# Patient Record
Sex: Male | Born: 1940
Health system: Southern US, Community
[De-identification: ages and names within clinical notes are randomized; demographics above are authoritative.]

## PROBLEM LIST (undated history)

## (undated) DIAGNOSIS — I10 Essential (primary) hypertension: Secondary | ICD-10-CM

---

## 2006-07-05 ENCOUNTER — Inpatient Hospital Stay (HOSPITAL_COMMUNITY): Admission: AD | Admit: 2006-07-05 | Discharge: 2006-07-13 | Payer: Self-pay | Admitting: Psychiatry

## 2006-07-05 ENCOUNTER — Ambulatory Visit: Payer: Self-pay | Admitting: Psychiatry

## 2006-10-11 ENCOUNTER — Inpatient Hospital Stay (HOSPITAL_COMMUNITY): Admission: AD | Admit: 2006-10-11 | Discharge: 2006-10-12 | Payer: Self-pay | Admitting: *Deleted

## 2006-10-11 ENCOUNTER — Ambulatory Visit: Payer: Self-pay | Admitting: *Deleted

## 2006-10-12 ENCOUNTER — Observation Stay (HOSPITAL_COMMUNITY): Admission: EM | Admit: 2006-10-12 | Discharge: 2006-10-13 | Payer: Self-pay | Admitting: Emergency Medicine

## 2006-10-15 ENCOUNTER — Ambulatory Visit: Payer: Self-pay

## 2008-09-27 IMAGING — CR DG CHEST 1V PORT
1 series · 1 of 1 positions shown · non-contrast
Comparison: none

CLINICAL DATA: Shortness of breath and chest tightness/discomfort. 
 PORTABLE CHEST 7609 HOURS:

[view not recorded]
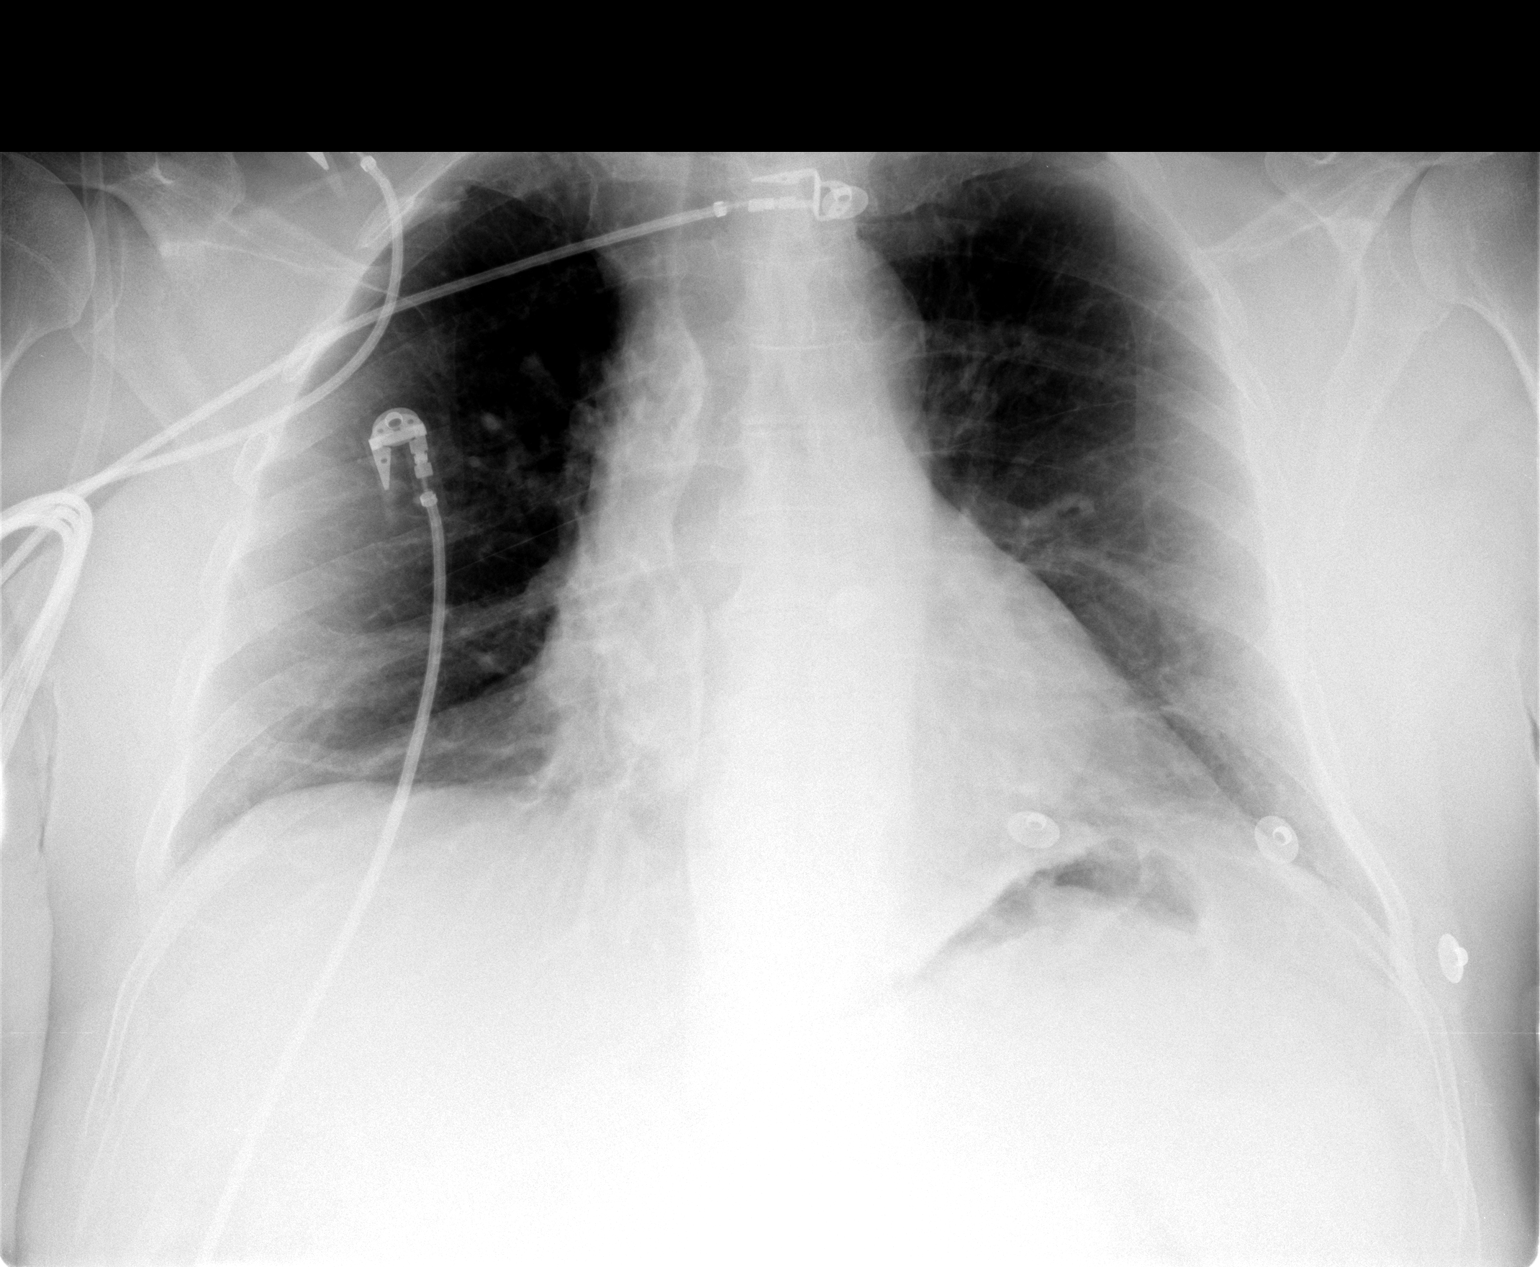

[1 of 1 positions shown; findings below may reference images not displayed]

FINDINGS: Cardiac size appears at the upper limits of normal to slightly enlarged. No CHF or acute lung process.
IMPRESSION: Probable mild cardiomegaly.

## 2010-09-07 ENCOUNTER — Encounter: Payer: Self-pay | Admitting: *Deleted

## 2011-10-09 DIAGNOSIS — M5412 Radiculopathy, cervical region: Secondary | ICD-10-CM | POA: Diagnosis not present

## 2011-10-09 DIAGNOSIS — G5620 Lesion of ulnar nerve, unspecified upper limb: Secondary | ICD-10-CM | POA: Diagnosis not present

## 2012-04-08 DIAGNOSIS — M5412 Radiculopathy, cervical region: Secondary | ICD-10-CM | POA: Diagnosis not present

## 2012-04-08 DIAGNOSIS — S066X9A Traumatic subarachnoid hemorrhage with loss of consciousness of unspecified duration, initial encounter: Secondary | ICD-10-CM | POA: Diagnosis not present

## 2012-09-26 DIAGNOSIS — Z125 Encounter for screening for malignant neoplasm of prostate: Secondary | ICD-10-CM | POA: Diagnosis not present

## 2012-09-26 DIAGNOSIS — D539 Nutritional anemia, unspecified: Secondary | ICD-10-CM | POA: Diagnosis not present

## 2012-09-26 DIAGNOSIS — Z79899 Other long term (current) drug therapy: Secondary | ICD-10-CM | POA: Diagnosis not present

## 2012-09-26 DIAGNOSIS — E7800 Pure hypercholesterolemia, unspecified: Secondary | ICD-10-CM | POA: Diagnosis not present

## 2012-10-25 DIAGNOSIS — D510 Vitamin B12 deficiency anemia due to intrinsic factor deficiency: Secondary | ICD-10-CM | POA: Diagnosis not present

## 2012-10-25 DIAGNOSIS — N400 Enlarged prostate without lower urinary tract symptoms: Secondary | ICD-10-CM | POA: Diagnosis not present

## 2012-11-21 DIAGNOSIS — H2510 Age-related nuclear cataract, unspecified eye: Secondary | ICD-10-CM | POA: Diagnosis not present

## 2013-09-04 DIAGNOSIS — L570 Actinic keratosis: Secondary | ICD-10-CM | POA: Diagnosis not present

## 2013-09-15 DIAGNOSIS — J069 Acute upper respiratory infection, unspecified: Secondary | ICD-10-CM | POA: Diagnosis not present

## 2013-09-15 DIAGNOSIS — J209 Acute bronchitis, unspecified: Secondary | ICD-10-CM | POA: Diagnosis not present

## 2014-11-16 DIAGNOSIS — F209 Schizophrenia, unspecified: Secondary | ICD-10-CM | POA: Diagnosis not present

## 2014-12-03 DIAGNOSIS — C4442 Squamous cell carcinoma of skin of scalp and neck: Secondary | ICD-10-CM | POA: Diagnosis not present

## 2014-12-06 DIAGNOSIS — C4441 Basal cell carcinoma of skin of scalp and neck: Secondary | ICD-10-CM | POA: Diagnosis not present

## 2014-12-26 DIAGNOSIS — F209 Schizophrenia, unspecified: Secondary | ICD-10-CM | POA: Diagnosis not present

## 2015-01-08 DIAGNOSIS — K219 Gastro-esophageal reflux disease without esophagitis: Secondary | ICD-10-CM | POA: Diagnosis not present

## 2015-01-08 DIAGNOSIS — D509 Iron deficiency anemia, unspecified: Secondary | ICD-10-CM | POA: Diagnosis not present

## 2015-01-08 DIAGNOSIS — Z9181 History of falling: Secondary | ICD-10-CM | POA: Diagnosis not present

## 2015-01-08 DIAGNOSIS — Z23 Encounter for immunization: Secondary | ICD-10-CM | POA: Diagnosis not present

## 2015-01-08 DIAGNOSIS — Z79899 Other long term (current) drug therapy: Secondary | ICD-10-CM | POA: Diagnosis not present

## 2015-01-08 DIAGNOSIS — F209 Schizophrenia, unspecified: Secondary | ICD-10-CM | POA: Diagnosis not present

## 2015-01-08 DIAGNOSIS — E780 Pure hypercholesterolemia: Secondary | ICD-10-CM | POA: Diagnosis not present

## 2015-01-08 DIAGNOSIS — Z0000 Encounter for general adult medical examination without abnormal findings: Secondary | ICD-10-CM | POA: Diagnosis not present

## 2015-03-20 DIAGNOSIS — F209 Schizophrenia, unspecified: Secondary | ICD-10-CM | POA: Diagnosis not present

## 2015-06-12 DIAGNOSIS — F209 Schizophrenia, unspecified: Secondary | ICD-10-CM | POA: Diagnosis not present

## 2015-07-03 DIAGNOSIS — L821 Other seborrheic keratosis: Secondary | ICD-10-CM | POA: Diagnosis not present

## 2015-07-03 DIAGNOSIS — C4441 Basal cell carcinoma of skin of scalp and neck: Secondary | ICD-10-CM | POA: Diagnosis not present

## 2015-09-13 DIAGNOSIS — K219 Gastro-esophageal reflux disease without esophagitis: Secondary | ICD-10-CM | POA: Diagnosis not present

## 2015-09-13 DIAGNOSIS — E7800 Pure hypercholesterolemia, unspecified: Secondary | ICD-10-CM | POA: Diagnosis not present

## 2015-09-16 DIAGNOSIS — F209 Schizophrenia, unspecified: Secondary | ICD-10-CM | POA: Diagnosis not present

## 2015-10-16 DIAGNOSIS — F209 Schizophrenia, unspecified: Secondary | ICD-10-CM | POA: Diagnosis not present

## 2016-01-07 DIAGNOSIS — C4441 Basal cell carcinoma of skin of scalp and neck: Secondary | ICD-10-CM | POA: Diagnosis not present

## 2016-01-07 DIAGNOSIS — L821 Other seborrheic keratosis: Secondary | ICD-10-CM | POA: Diagnosis not present

## 2016-01-07 DIAGNOSIS — L570 Actinic keratosis: Secondary | ICD-10-CM | POA: Diagnosis not present

## 2016-01-08 DIAGNOSIS — F209 Schizophrenia, unspecified: Secondary | ICD-10-CM | POA: Diagnosis not present

## 2016-03-04 DIAGNOSIS — Z1389 Encounter for screening for other disorder: Secondary | ICD-10-CM | POA: Diagnosis not present

## 2016-03-04 DIAGNOSIS — Z9181 History of falling: Secondary | ICD-10-CM | POA: Diagnosis not present

## 2016-03-04 DIAGNOSIS — Z79899 Other long term (current) drug therapy: Secondary | ICD-10-CM | POA: Diagnosis not present

## 2016-03-04 DIAGNOSIS — E785 Hyperlipidemia, unspecified: Secondary | ICD-10-CM | POA: Diagnosis not present

## 2016-03-04 DIAGNOSIS — D519 Vitamin B12 deficiency anemia, unspecified: Secondary | ICD-10-CM | POA: Diagnosis not present

## 2016-03-04 DIAGNOSIS — Z0000 Encounter for general adult medical examination without abnormal findings: Secondary | ICD-10-CM | POA: Diagnosis not present

## 2016-03-09 DIAGNOSIS — E538 Deficiency of other specified B group vitamins: Secondary | ICD-10-CM | POA: Diagnosis not present

## 2016-04-01 DIAGNOSIS — F209 Schizophrenia, unspecified: Secondary | ICD-10-CM | POA: Diagnosis not present

## 2016-04-09 DIAGNOSIS — E538 Deficiency of other specified B group vitamins: Secondary | ICD-10-CM | POA: Diagnosis not present

## 2016-05-11 DIAGNOSIS — E538 Deficiency of other specified B group vitamins: Secondary | ICD-10-CM | POA: Diagnosis not present

## 2016-06-10 DIAGNOSIS — E538 Deficiency of other specified B group vitamins: Secondary | ICD-10-CM | POA: Diagnosis not present

## 2016-06-25 DIAGNOSIS — F209 Schizophrenia, unspecified: Secondary | ICD-10-CM | POA: Diagnosis not present

## 2016-07-17 DIAGNOSIS — Z23 Encounter for immunization: Secondary | ICD-10-CM | POA: Diagnosis not present

## 2016-07-17 DIAGNOSIS — E538 Deficiency of other specified B group vitamins: Secondary | ICD-10-CM | POA: Diagnosis not present

## 2016-07-23 DIAGNOSIS — R030 Elevated blood-pressure reading, without diagnosis of hypertension: Secondary | ICD-10-CM | POA: Diagnosis not present

## 2016-08-21 DIAGNOSIS — E538 Deficiency of other specified B group vitamins: Secondary | ICD-10-CM | POA: Diagnosis not present

## 2016-09-15 DIAGNOSIS — E538 Deficiency of other specified B group vitamins: Secondary | ICD-10-CM | POA: Diagnosis not present

## 2016-09-17 DIAGNOSIS — J209 Acute bronchitis, unspecified: Secondary | ICD-10-CM | POA: Diagnosis not present

## 2016-09-30 DIAGNOSIS — F209 Schizophrenia, unspecified: Secondary | ICD-10-CM | POA: Diagnosis not present

## 2016-10-07 DIAGNOSIS — N400 Enlarged prostate without lower urinary tract symptoms: Secondary | ICD-10-CM | POA: Diagnosis not present

## 2016-10-07 DIAGNOSIS — D510 Vitamin B12 deficiency anemia due to intrinsic factor deficiency: Secondary | ICD-10-CM | POA: Diagnosis not present

## 2016-10-07 DIAGNOSIS — N3941 Urge incontinence: Secondary | ICD-10-CM | POA: Diagnosis not present

## 2016-11-12 DIAGNOSIS — D510 Vitamin B12 deficiency anemia due to intrinsic factor deficiency: Secondary | ICD-10-CM | POA: Diagnosis not present

## 2016-12-23 DIAGNOSIS — F209 Schizophrenia, unspecified: Secondary | ICD-10-CM | POA: Diagnosis not present

## 2016-12-24 DIAGNOSIS — D510 Vitamin B12 deficiency anemia due to intrinsic factor deficiency: Secondary | ICD-10-CM | POA: Diagnosis not present

## 2017-01-07 DIAGNOSIS — D510 Vitamin B12 deficiency anemia due to intrinsic factor deficiency: Secondary | ICD-10-CM | POA: Diagnosis not present

## 2017-02-09 DIAGNOSIS — D510 Vitamin B12 deficiency anemia due to intrinsic factor deficiency: Secondary | ICD-10-CM | POA: Diagnosis not present

## 2017-04-01 DIAGNOSIS — Z6827 Body mass index (BMI) 27.0-27.9, adult: Secondary | ICD-10-CM | POA: Diagnosis not present

## 2017-04-01 DIAGNOSIS — Z23 Encounter for immunization: Secondary | ICD-10-CM | POA: Diagnosis not present

## 2017-04-01 DIAGNOSIS — Z1389 Encounter for screening for other disorder: Secondary | ICD-10-CM | POA: Diagnosis not present

## 2017-04-01 DIAGNOSIS — Z0000 Encounter for general adult medical examination without abnormal findings: Secondary | ICD-10-CM | POA: Diagnosis not present

## 2017-04-15 DIAGNOSIS — D510 Vitamin B12 deficiency anemia due to intrinsic factor deficiency: Secondary | ICD-10-CM | POA: Diagnosis not present

## 2017-05-13 DIAGNOSIS — D510 Vitamin B12 deficiency anemia due to intrinsic factor deficiency: Secondary | ICD-10-CM | POA: Diagnosis not present

## 2017-05-26 DIAGNOSIS — F209 Schizophrenia, unspecified: Secondary | ICD-10-CM | POA: Diagnosis not present

## 2017-06-10 DIAGNOSIS — D510 Vitamin B12 deficiency anemia due to intrinsic factor deficiency: Secondary | ICD-10-CM | POA: Diagnosis not present

## 2017-07-14 DIAGNOSIS — S63257A Unspecified dislocation of left little finger, initial encounter: Secondary | ICD-10-CM | POA: Diagnosis not present

## 2017-07-14 DIAGNOSIS — S0990XA Unspecified injury of head, initial encounter: Secondary | ICD-10-CM | POA: Diagnosis not present

## 2017-07-22 DIAGNOSIS — D510 Vitamin B12 deficiency anemia due to intrinsic factor deficiency: Secondary | ICD-10-CM | POA: Diagnosis not present

## 2017-08-02 DIAGNOSIS — Z6825 Body mass index (BMI) 25.0-25.9, adult: Secondary | ICD-10-CM | POA: Diagnosis not present

## 2017-08-02 DIAGNOSIS — S63055A Dislocation of other carpometacarpal joint of left hand, initial encounter: Secondary | ICD-10-CM | POA: Diagnosis not present

## 2017-08-02 DIAGNOSIS — W19XXXS Unspecified fall, sequela: Secondary | ICD-10-CM | POA: Diagnosis not present

## 2017-08-02 DIAGNOSIS — Z1339 Encounter for screening examination for other mental health and behavioral disorders: Secondary | ICD-10-CM | POA: Diagnosis not present

## 2017-08-18 DIAGNOSIS — D510 Vitamin B12 deficiency anemia due to intrinsic factor deficiency: Secondary | ICD-10-CM | POA: Diagnosis not present

## 2017-09-16 DIAGNOSIS — D510 Vitamin B12 deficiency anemia due to intrinsic factor deficiency: Secondary | ICD-10-CM | POA: Diagnosis not present

## 2017-09-17 DIAGNOSIS — S63259A Unspecified dislocation of unspecified finger, initial encounter: Secondary | ICD-10-CM | POA: Diagnosis not present

## 2017-09-17 DIAGNOSIS — S63287A Dislocation of proximal interphalangeal joint of left little finger, initial encounter: Secondary | ICD-10-CM | POA: Diagnosis not present

## 2017-09-21 DIAGNOSIS — S199XXA Unspecified injury of neck, initial encounter: Secondary | ICD-10-CM | POA: Diagnosis not present

## 2017-09-21 DIAGNOSIS — S0990XA Unspecified injury of head, initial encounter: Secondary | ICD-10-CM | POA: Diagnosis not present

## 2017-09-21 DIAGNOSIS — S01511A Laceration without foreign body of lip, initial encounter: Secondary | ICD-10-CM | POA: Diagnosis not present

## 2017-09-21 DIAGNOSIS — S0993XA Unspecified injury of face, initial encounter: Secondary | ICD-10-CM | POA: Diagnosis not present

## 2017-10-14 DIAGNOSIS — D510 Vitamin B12 deficiency anemia due to intrinsic factor deficiency: Secondary | ICD-10-CM | POA: Diagnosis not present

## 2017-11-11 DIAGNOSIS — D510 Vitamin B12 deficiency anemia due to intrinsic factor deficiency: Secondary | ICD-10-CM | POA: Diagnosis not present

## 2017-11-24 DIAGNOSIS — D510 Vitamin B12 deficiency anemia due to intrinsic factor deficiency: Secondary | ICD-10-CM | POA: Diagnosis not present

## 2017-11-24 DIAGNOSIS — F209 Schizophrenia, unspecified: Secondary | ICD-10-CM | POA: Diagnosis not present

## 2017-12-09 DIAGNOSIS — D510 Vitamin B12 deficiency anemia due to intrinsic factor deficiency: Secondary | ICD-10-CM | POA: Diagnosis not present

## 2017-12-31 ENCOUNTER — Emergency Department (HOSPITAL_COMMUNITY): Payer: PPO

## 2017-12-31 ENCOUNTER — Encounter (HOSPITAL_COMMUNITY): Payer: Self-pay

## 2017-12-31 ENCOUNTER — Emergency Department (HOSPITAL_COMMUNITY)
Admission: EM | Admit: 2017-12-31 | Discharge: 2017-12-31 | Disposition: A | Discharge status: Home / Self Care | Payer: PPO | Attending: Emergency Medicine | Admitting: Emergency Medicine

## 2017-12-31 ENCOUNTER — Other Ambulatory Visit: Payer: Self-pay

## 2017-12-31 DIAGNOSIS — Y9389 Activity, other specified: Secondary | ICD-10-CM | POA: Insufficient documentation

## 2017-12-31 DIAGNOSIS — S065X9A Traumatic subdural hemorrhage with loss of consciousness of unspecified duration, initial encounter: Secondary | ICD-10-CM | POA: Insufficient documentation

## 2017-12-31 DIAGNOSIS — M25511 Pain in right shoulder: Secondary | ICD-10-CM | POA: Insufficient documentation

## 2017-12-31 DIAGNOSIS — M25512 Pain in left shoulder: Secondary | ICD-10-CM | POA: Insufficient documentation

## 2017-12-31 DIAGNOSIS — Y33XXXA Other specified events, undetermined intent, initial encounter: Secondary | ICD-10-CM | POA: Diagnosis not present

## 2017-12-31 DIAGNOSIS — S59901A Unspecified injury of right elbow, initial encounter: Secondary | ICD-10-CM | POA: Diagnosis not present

## 2017-12-31 DIAGNOSIS — S0003XA Contusion of scalp, initial encounter: Secondary | ICD-10-CM | POA: Diagnosis not present

## 2017-12-31 DIAGNOSIS — M25521 Pain in right elbow: Secondary | ICD-10-CM | POA: Diagnosis not present

## 2017-12-31 DIAGNOSIS — R10819 Abdominal tenderness, unspecified site: Secondary | ICD-10-CM | POA: Diagnosis not present

## 2017-12-31 DIAGNOSIS — S3991XA Unspecified injury of abdomen, initial encounter: Secondary | ICD-10-CM | POA: Diagnosis not present

## 2017-12-31 DIAGNOSIS — R079 Chest pain, unspecified: Secondary | ICD-10-CM | POA: Insufficient documentation

## 2017-12-31 DIAGNOSIS — S0993XA Unspecified injury of face, initial encounter: Secondary | ICD-10-CM | POA: Diagnosis not present

## 2017-12-31 DIAGNOSIS — S4992XA Unspecified injury of left shoulder and upper arm, initial encounter: Secondary | ICD-10-CM | POA: Diagnosis not present

## 2017-12-31 DIAGNOSIS — Z79899 Other long term (current) drug therapy: Secondary | ICD-10-CM | POA: Diagnosis not present

## 2017-12-31 DIAGNOSIS — M79602 Pain in left arm: Secondary | ICD-10-CM | POA: Diagnosis not present

## 2017-12-31 DIAGNOSIS — S299XXA Unspecified injury of thorax, initial encounter: Secondary | ICD-10-CM | POA: Diagnosis not present

## 2017-12-31 DIAGNOSIS — Y92410 Unspecified street and highway as the place of occurrence of the external cause: Secondary | ICD-10-CM | POA: Diagnosis not present

## 2017-12-31 DIAGNOSIS — Y998 Other external cause status: Secondary | ICD-10-CM | POA: Diagnosis not present

## 2017-12-31 DIAGNOSIS — R109 Unspecified abdominal pain: Secondary | ICD-10-CM | POA: Insufficient documentation

## 2017-12-31 DIAGNOSIS — S199XXA Unspecified injury of neck, initial encounter: Secondary | ICD-10-CM | POA: Diagnosis not present

## 2017-12-31 DIAGNOSIS — S41111A Laceration without foreign body of right upper arm, initial encounter: Secondary | ICD-10-CM | POA: Diagnosis not present

## 2017-12-31 DIAGNOSIS — S3690XA Unspecified injury of unspecified intra-abdominal organ, initial encounter: Secondary | ICD-10-CM | POA: Diagnosis not present

## 2017-12-31 DIAGNOSIS — S065XAA Traumatic subdural hemorrhage with loss of consciousness status unknown, initial encounter: Secondary | ICD-10-CM

## 2017-12-31 DIAGNOSIS — Z87891 Personal history of nicotine dependence: Secondary | ICD-10-CM | POA: Diagnosis not present

## 2017-12-31 DIAGNOSIS — I10 Essential (primary) hypertension: Secondary | ICD-10-CM | POA: Insufficient documentation

## 2017-12-31 HISTORY — DX: Essential (primary) hypertension: I10

## 2017-12-31 LAB — COMPREHENSIVE METABOLIC PANEL
ALBUMIN: 3.8 g/dL (ref 3.5–5.0)
ALK PHOS: 78 U/L (ref 38–126)
ALT: 14 U/L — ABNORMAL LOW (ref 17–63)
ANION GAP: 12 (ref 5–15)
AST: 28 U/L (ref 15–41)
BILIRUBIN TOTAL: 0.9 mg/dL (ref 0.3–1.2)
BUN: 11 mg/dL (ref 6–20)
CALCIUM: 8.5 mg/dL — AB (ref 8.9–10.3)
CO2: 26 mmol/L (ref 22–32)
Chloride: 102 mmol/L (ref 101–111)
Creatinine, Ser: 0.9 mg/dL (ref 0.61–1.24)
GFR calc non Af Amer: >60 mL/min (ref >60)
GLUCOSE: 103 mg/dL — AB (ref 65–99)
POTASSIUM: 2.9 mmol/L — AB (ref 3.5–5.1)
Sodium: 140 mmol/L (ref 135–145)
TOTAL PROTEIN: 6 g/dL — AB (ref 6.5–8.1)

## 2017-12-31 LAB — LIPASE, BLOOD: LIPASE: 25 U/L (ref 11–51)

## 2017-12-31 LAB — CBC WITH DIFFERENTIAL/PLATELET
Abs Immature Granulocytes: 0.1 10*3/uL (ref 0.0–0.1)
BASOS PCT: 0 %
Basophils Absolute: 0 10*3/uL (ref 0.0–0.1)
EOS PCT: 0 %
Eosinophils Absolute: 0 10*3/uL (ref 0.0–0.7)
HEMATOCRIT: 38.5 % — AB (ref 39.0–52.0)
Hemoglobin: 13.5 g/dL (ref 13.0–17.0)
Immature Granulocytes: 1 %
LYMPHS ABS: 0.8 10*3/uL (ref 0.7–4.0)
Lymphocytes Relative: 8 %
MCH: 30.3 pg (ref 26.0–34.0)
MCHC: 35.1 g/dL (ref 30.0–36.0)
MCV: 86.5 fL (ref 78.0–100.0)
MONOS PCT: 6 %
Monocytes Absolute: 0.7 10*3/uL (ref 0.1–1.0)
Neutro Abs: 8.9 10*3/uL — ABNORMAL HIGH (ref 1.7–7.7)
Neutrophils Relative %: 85 %
Platelets: 208 10*3/uL (ref 150–400)
RBC: 4.45 MIL/uL (ref 4.22–5.81)
RDW: 12.4 % (ref 11.5–15.5)
WBC: 10.6 10*3/uL — AB (ref 4.0–10.5)

## 2017-12-31 LAB — URINALYSIS, ROUTINE W REFLEX MICROSCOPIC
BILIRUBIN URINE: NEGATIVE
GLUCOSE, UA: NEGATIVE mg/dL
KETONES UR: 5 mg/dL — AB
LEUKOCYTES UA: NEGATIVE
NITRITE: NEGATIVE
PH: 7 (ref 5.0–8.0)
Protein, ur: NEGATIVE mg/dL
SPECIFIC GRAVITY, URINE: 1.029 (ref 1.005–1.030)

## 2017-12-31 LAB — I-STAT CREATININE, ED: Creatinine, Ser: 0.8 mg/dL (ref 0.61–1.24)

## 2017-12-31 MED ORDER — FENTANYL CITRATE (PF) 100 MCG/2ML IJ SOLN
25.0000 ug | Freq: Once | INTRAMUSCULAR | Status: AC
  Administered 2017-12-31: 25 ug via INTRAVENOUS
  Filled 2017-12-31: qty 2

## 2017-12-31 MED ORDER — HYDROCODONE-ACETAMINOPHEN 5-325 MG PO TABS: 1.0000 | ORAL_TABLET | Freq: Two times a day (BID) | ORAL | 0 refills | Status: DC | PRN

## 2017-12-31 MED ORDER — HYDROCODONE-ACETAMINOPHEN 5-325 MG PO TABS: 1.0000 | ORAL_TABLET | Freq: Once | ORAL | Status: DC

## 2017-12-31 MED ORDER — DOXYCYCLINE HYCLATE 100 MG PO CAPS: 100.0000 mg | ORAL_CAPSULE | Freq: Two times a day (BID) | ORAL | 0 refills | Status: AC

## 2017-12-31 MED ORDER — FENTANYL CITRATE (PF) 100 MCG/2ML IJ SOLN
25.0000 ug | Freq: Once | INTRAMUSCULAR | Status: AC
Start: 2017-12-31 — End: 2017-12-31
  Administered 2017-12-31: 25 ug via INTRAVENOUS
  Filled 2017-12-31: qty 2

## 2017-12-31 MED ORDER — POTASSIUM CHLORIDE CRYS ER 20 MEQ PO TBCR
40.0000 meq | EXTENDED_RELEASE_TABLET | Freq: Once | ORAL | Status: AC
  Administered 2017-12-31: 40 meq via ORAL
  Filled 2017-12-31: qty 2

## 2017-12-31 MED ORDER — LIDOCAINE-EPINEPHRINE (PF) 2 %-1:200000 IJ SOLN
10.0000 mL | Freq: Once | INTRAMUSCULAR | Status: AC
  Administered 2017-12-31: 10 mL via INTRADERMAL
  Filled 2017-12-31: qty 20

## 2017-12-31 MED ORDER — IOHEXOL 300 MG/ML  SOLN
100.0000 mL | Freq: Once | INTRAMUSCULAR | Status: AC | PRN
  Administered 2017-12-31: 100 mL via INTRAVENOUS

## 2017-12-31 MED ORDER — SODIUM CHLORIDE 0.9 % IV BOLUS
500.0000 mL | Freq: Once | INTRAVENOUS | Status: AC
  Administered 2017-12-31: 500 mL via INTRAVENOUS

## 2017-12-31 NOTE — ED Notes (Signed)
Pt transported from xray to CT

## 2017-12-31 NOTE — ED Notes (Signed)
Portable xray at bedside.
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
## 2017-12-31 NOTE — Progress Notes (Signed)
Orthopedic Tech Progress Note Patient Details:  [name] Nov 01, 1940 [id number] Called [employer] for brace. Patient ID: Keith Schaefer, male   DOB: 1940/09/12, 77 y.o.   MRN: [id number]   Braulio Bosch 12/31/2017, 9:22 PM

## 2017-12-31 NOTE — Discharge Instructions (Addendum)
You were given a prescription for antibiotics. Please take the antibiotic prescription fully.   I have prescribed a new medication for you today. It is important that when you pick the prescription up you discuss the potential interactions of this medication with other medications you are taking, including over the counter medications, with the pharmacists.   This new medication has potential side effects. Be sure to contact your primary care provider or return to the emergency department if you are experiencing new symptoms that you are unable to tolerate after starting the medication. You need to receive medical evaluation immediately if you start to experience blistering of the skin, rash, swelling, or difficulty breathing as these signs could indicate a more serious medication side effect.   Please return to either your primary care doctor, urgent care, or the ER to have your sutures removed in 10 to 14 days.  Please follow-up with your primary care regardless next week for reevaluation.  You were given a back brace to help with your symptoms of pain.  You were also given pain medication for your symptoms.  You may take 500 to 650 mg of Tylenol every 6 hours for your pain.  If you are having breakthrough pain you may take 1 Norco.  Do not exceed more than 4 g of Tylenol in a day.  Please return to the ER if you are having any changes in mental status, confusion, difficulty walking, numbness or tingling to your legs, weakness in your legs, loss of control of your bowels or bladder function, or any new or worsening symptoms.
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
## 2018-01-31 DIAGNOSIS — E876 Hypokalemia: Secondary | ICD-10-CM | POA: Diagnosis not present

## 2018-02-02 DIAGNOSIS — F209 Schizophrenia, unspecified: Secondary | ICD-10-CM | POA: Diagnosis not present

## 2018-02-16 DIAGNOSIS — F209 Schizophrenia, unspecified: Secondary | ICD-10-CM | POA: Diagnosis not present

## 2018-03-03 DIAGNOSIS — F209 Schizophrenia, unspecified: Secondary | ICD-10-CM | POA: Diagnosis not present

## 2018-03-15 DIAGNOSIS — F209 Schizophrenia, unspecified: Secondary | ICD-10-CM | POA: Diagnosis not present

## 2018-03-30 DIAGNOSIS — F209 Schizophrenia, unspecified: Secondary | ICD-10-CM | POA: Diagnosis not present

## 2018-05-02 DIAGNOSIS — F209 Schizophrenia, unspecified: Secondary | ICD-10-CM | POA: Diagnosis not present

## 2018-06-08 DIAGNOSIS — Z23 Encounter for immunization: Secondary | ICD-10-CM | POA: Diagnosis not present

## 2018-08-03 DIAGNOSIS — F209 Schizophrenia, unspecified: Secondary | ICD-10-CM | POA: Diagnosis not present

## 2018-08-25 DIAGNOSIS — F209 Schizophrenia, unspecified: Secondary | ICD-10-CM | POA: Diagnosis not present

## 2018-10-19 DIAGNOSIS — F209 Schizophrenia, unspecified: Secondary | ICD-10-CM | POA: Diagnosis not present

## 2018-10-30 DIAGNOSIS — R51 Headache: Secondary | ICD-10-CM | POA: Diagnosis not present

## 2018-10-30 DIAGNOSIS — M25512 Pain in left shoulder: Secondary | ICD-10-CM | POA: Diagnosis not present

## 2018-10-30 DIAGNOSIS — S43085A Other dislocation of left shoulder joint, initial encounter: Secondary | ICD-10-CM | POA: Diagnosis not present

## 2018-10-30 DIAGNOSIS — S0990XA Unspecified injury of head, initial encounter: Secondary | ICD-10-CM | POA: Diagnosis not present

## 2018-10-30 DIAGNOSIS — S4292XA Fracture of left shoulder girdle, part unspecified, initial encounter for closed fracture: Secondary | ICD-10-CM | POA: Diagnosis not present

## 2018-10-30 DIAGNOSIS — S43005A Unspecified dislocation of left shoulder joint, initial encounter: Secondary | ICD-10-CM | POA: Diagnosis not present

## 2018-10-30 DIAGNOSIS — S42292A Other displaced fracture of upper end of left humerus, initial encounter for closed fracture: Secondary | ICD-10-CM | POA: Diagnosis not present

## 2018-10-30 DIAGNOSIS — S0083XA Contusion of other part of head, initial encounter: Secondary | ICD-10-CM | POA: Diagnosis not present

## 2018-10-31 DIAGNOSIS — S42202A Unspecified fracture of upper end of left humerus, initial encounter for closed fracture: Secondary | ICD-10-CM | POA: Diagnosis not present

## 2018-10-31 DIAGNOSIS — S43005A Unspecified dislocation of left shoulder joint, initial encounter: Secondary | ICD-10-CM | POA: Diagnosis not present

## 2018-11-14 DIAGNOSIS — S42202A Unspecified fracture of upper end of left humerus, initial encounter for closed fracture: Secondary | ICD-10-CM | POA: Diagnosis not present

## 2019-01-06 DIAGNOSIS — L03039 Cellulitis of unspecified toe: Secondary | ICD-10-CM | POA: Diagnosis not present

## 2019-01-06 DIAGNOSIS — J40 Bronchitis, not specified as acute or chronic: Secondary | ICD-10-CM | POA: Diagnosis not present

## 2019-01-06 DIAGNOSIS — F209 Schizophrenia, unspecified: Secondary | ICD-10-CM | POA: Diagnosis not present

## 2019-01-06 DIAGNOSIS — E7800 Pure hypercholesterolemia, unspecified: Secondary | ICD-10-CM | POA: Diagnosis not present

## 2019-01-11 DIAGNOSIS — F209 Schizophrenia, unspecified: Secondary | ICD-10-CM | POA: Diagnosis not present

## 2019-02-14 DIAGNOSIS — F209 Schizophrenia, unspecified: Secondary | ICD-10-CM | POA: Diagnosis not present

## 2019-02-14 DIAGNOSIS — D519 Vitamin B12 deficiency anemia, unspecified: Secondary | ICD-10-CM | POA: Diagnosis not present

## 2019-02-14 DIAGNOSIS — K219 Gastro-esophageal reflux disease without esophagitis: Secondary | ICD-10-CM | POA: Diagnosis not present

## 2019-03-09 DIAGNOSIS — F209 Schizophrenia, unspecified: Secondary | ICD-10-CM | POA: Diagnosis not present

## 2019-03-17 ENCOUNTER — Other Ambulatory Visit: Payer: Self-pay

## 2019-03-23 DIAGNOSIS — F209 Schizophrenia, unspecified: Secondary | ICD-10-CM | POA: Diagnosis not present

## 2019-04-06 DIAGNOSIS — F209 Schizophrenia, unspecified: Secondary | ICD-10-CM | POA: Diagnosis not present

## 2019-04-12 DIAGNOSIS — F209 Schizophrenia, unspecified: Secondary | ICD-10-CM | POA: Diagnosis not present

## 2019-04-20 DIAGNOSIS — F209 Schizophrenia, unspecified: Secondary | ICD-10-CM | POA: Diagnosis not present

## 2019-05-04 DIAGNOSIS — F209 Schizophrenia, unspecified: Secondary | ICD-10-CM | POA: Diagnosis not present

## 2019-05-16 DIAGNOSIS — F209 Schizophrenia, unspecified: Secondary | ICD-10-CM | POA: Diagnosis not present

## 2019-05-18 DIAGNOSIS — F209 Schizophrenia, unspecified: Secondary | ICD-10-CM | POA: Diagnosis not present

## 2019-06-01 DIAGNOSIS — F209 Schizophrenia, unspecified: Secondary | ICD-10-CM | POA: Diagnosis not present

## 2019-06-05 DIAGNOSIS — F209 Schizophrenia, unspecified: Secondary | ICD-10-CM | POA: Diagnosis not present

## 2019-06-05 DIAGNOSIS — Z23 Encounter for immunization: Secondary | ICD-10-CM | POA: Diagnosis not present

## 2019-06-05 DIAGNOSIS — R4182 Altered mental status, unspecified: Secondary | ICD-10-CM | POA: Diagnosis not present

## 2019-06-05 DIAGNOSIS — Z79899 Other long term (current) drug therapy: Secondary | ICD-10-CM | POA: Diagnosis not present

## 2019-06-07 DIAGNOSIS — E785 Hyperlipidemia, unspecified: Secondary | ICD-10-CM | POA: Diagnosis not present

## 2019-06-07 DIAGNOSIS — F209 Schizophrenia, unspecified: Secondary | ICD-10-CM | POA: Diagnosis not present

## 2019-06-07 DIAGNOSIS — Z20828 Contact with and (suspected) exposure to other viral communicable diseases: Secondary | ICD-10-CM | POA: Diagnosis not present

## 2019-06-07 DIAGNOSIS — N400 Enlarged prostate without lower urinary tract symptoms: Secondary | ICD-10-CM | POA: Diagnosis not present

## 2019-06-07 DIAGNOSIS — J9690 Respiratory failure, unspecified, unspecified whether with hypoxia or hypercapnia: Secondary | ICD-10-CM | POA: Diagnosis not present

## 2019-06-07 DIAGNOSIS — E538 Deficiency of other specified B group vitamins: Secondary | ICD-10-CM | POA: Diagnosis not present

## 2019-06-07 DIAGNOSIS — K5900 Constipation, unspecified: Secondary | ICD-10-CM | POA: Diagnosis not present

## 2019-06-07 DIAGNOSIS — Z79899 Other long term (current) drug therapy: Secondary | ICD-10-CM | POA: Diagnosis not present

## 2019-06-07 DIAGNOSIS — K219 Gastro-esophageal reflux disease without esophagitis: Secondary | ICD-10-CM | POA: Diagnosis not present

## 2019-06-08 DIAGNOSIS — F209 Schizophrenia, unspecified: Secondary | ICD-10-CM | POA: Diagnosis not present

## 2019-06-09 DIAGNOSIS — F209 Schizophrenia, unspecified: Secondary | ICD-10-CM | POA: Diagnosis not present

## 2019-06-09 DIAGNOSIS — N400 Enlarged prostate without lower urinary tract symptoms: Secondary | ICD-10-CM | POA: Diagnosis not present

## 2019-06-09 DIAGNOSIS — E785 Hyperlipidemia, unspecified: Secondary | ICD-10-CM | POA: Diagnosis not present

## 2019-06-09 DIAGNOSIS — K219 Gastro-esophageal reflux disease without esophagitis: Secondary | ICD-10-CM | POA: Diagnosis not present

## 2019-06-10 DIAGNOSIS — F209 Schizophrenia, unspecified: Secondary | ICD-10-CM | POA: Diagnosis not present

## 2019-06-11 DIAGNOSIS — K219 Gastro-esophageal reflux disease without esophagitis: Secondary | ICD-10-CM | POA: Diagnosis not present

## 2019-06-11 DIAGNOSIS — F209 Schizophrenia, unspecified: Secondary | ICD-10-CM | POA: Diagnosis not present

## 2019-06-11 DIAGNOSIS — N400 Enlarged prostate without lower urinary tract symptoms: Secondary | ICD-10-CM | POA: Diagnosis not present

## 2019-06-11 DIAGNOSIS — E785 Hyperlipidemia, unspecified: Secondary | ICD-10-CM | POA: Diagnosis not present

## 2019-06-12 DIAGNOSIS — F209 Schizophrenia, unspecified: Secondary | ICD-10-CM | POA: Diagnosis not present

## 2019-06-13 DIAGNOSIS — F209 Schizophrenia, unspecified: Secondary | ICD-10-CM | POA: Diagnosis not present

## 2019-06-13 DIAGNOSIS — K219 Gastro-esophageal reflux disease without esophagitis: Secondary | ICD-10-CM | POA: Diagnosis not present

## 2019-06-13 DIAGNOSIS — E785 Hyperlipidemia, unspecified: Secondary | ICD-10-CM | POA: Diagnosis not present

## 2019-06-13 DIAGNOSIS — N400 Enlarged prostate without lower urinary tract symptoms: Secondary | ICD-10-CM | POA: Diagnosis not present

## 2019-06-14 DIAGNOSIS — F209 Schizophrenia, unspecified: Secondary | ICD-10-CM | POA: Diagnosis not present

## 2019-06-27 DIAGNOSIS — F209 Schizophrenia, unspecified: Secondary | ICD-10-CM | POA: Diagnosis not present

## 2019-07-04 DIAGNOSIS — F209 Schizophrenia, unspecified: Secondary | ICD-10-CM | POA: Diagnosis not present

## 2019-07-11 DIAGNOSIS — F209 Schizophrenia, unspecified: Secondary | ICD-10-CM | POA: Diagnosis not present

## 2019-07-17 DIAGNOSIS — D519 Vitamin B12 deficiency anemia, unspecified: Secondary | ICD-10-CM | POA: Diagnosis not present

## 2019-07-17 DIAGNOSIS — K219 Gastro-esophageal reflux disease without esophagitis: Secondary | ICD-10-CM | POA: Diagnosis not present

## 2019-07-18 DIAGNOSIS — K5909 Other constipation: Secondary | ICD-10-CM | POA: Diagnosis not present

## 2019-07-18 DIAGNOSIS — F203 Undifferentiated schizophrenia: Secondary | ICD-10-CM | POA: Diagnosis not present

## 2019-07-18 DIAGNOSIS — Z79899 Other long term (current) drug therapy: Secondary | ICD-10-CM | POA: Diagnosis not present

## 2019-07-18 DIAGNOSIS — R443 Hallucinations, unspecified: Secondary | ICD-10-CM | POA: Diagnosis not present

## 2019-07-18 DIAGNOSIS — Z20828 Contact with and (suspected) exposure to other viral communicable diseases: Secondary | ICD-10-CM | POA: Diagnosis not present

## 2019-07-18 DIAGNOSIS — F39 Unspecified mood [affective] disorder: Secondary | ICD-10-CM | POA: Diagnosis not present

## 2019-07-18 DIAGNOSIS — E538 Deficiency of other specified B group vitamins: Secondary | ICD-10-CM | POA: Diagnosis not present

## 2019-07-18 DIAGNOSIS — F209 Schizophrenia, unspecified: Secondary | ICD-10-CM | POA: Diagnosis not present

## 2019-07-18 DIAGNOSIS — E785 Hyperlipidemia, unspecified: Secondary | ICD-10-CM | POA: Diagnosis not present

## 2019-07-18 DIAGNOSIS — K219 Gastro-esophageal reflux disease without esophagitis: Secondary | ICD-10-CM | POA: Diagnosis not present

## 2019-07-18 DIAGNOSIS — N400 Enlarged prostate without lower urinary tract symptoms: Secondary | ICD-10-CM | POA: Diagnosis not present

## 2019-08-09 DIAGNOSIS — F209 Schizophrenia, unspecified: Secondary | ICD-10-CM | POA: Diagnosis not present

## 2019-08-14 DIAGNOSIS — F209 Schizophrenia, unspecified: Secondary | ICD-10-CM | POA: Diagnosis not present

## 2019-08-23 DIAGNOSIS — F209 Schizophrenia, unspecified: Secondary | ICD-10-CM | POA: Diagnosis not present

## 2019-09-06 DIAGNOSIS — F209 Schizophrenia, unspecified: Secondary | ICD-10-CM | POA: Diagnosis not present

## 2019-09-16 DIAGNOSIS — N400 Enlarged prostate without lower urinary tract symptoms: Secondary | ICD-10-CM | POA: Diagnosis not present

## 2019-09-16 DIAGNOSIS — E7800 Pure hypercholesterolemia, unspecified: Secondary | ICD-10-CM | POA: Diagnosis not present

## 2019-09-16 DIAGNOSIS — D519 Vitamin B12 deficiency anemia, unspecified: Secondary | ICD-10-CM | POA: Diagnosis not present

## 2019-09-21 DIAGNOSIS — F209 Schizophrenia, unspecified: Secondary | ICD-10-CM | POA: Diagnosis not present

## 2019-09-27 DIAGNOSIS — I4581 Long QT syndrome: Secondary | ICD-10-CM | POA: Diagnosis not present

## 2019-09-27 DIAGNOSIS — K219 Gastro-esophageal reflux disease without esophagitis: Secondary | ICD-10-CM | POA: Diagnosis not present

## 2019-09-27 DIAGNOSIS — Z23 Encounter for immunization: Secondary | ICD-10-CM | POA: Diagnosis not present

## 2019-09-27 DIAGNOSIS — E785 Hyperlipidemia, unspecified: Secondary | ICD-10-CM | POA: Diagnosis not present

## 2019-09-27 DIAGNOSIS — Z20822 Contact with and (suspected) exposure to covid-19: Secondary | ICD-10-CM | POA: Diagnosis not present

## 2019-09-27 DIAGNOSIS — F0390 Unspecified dementia without behavioral disturbance: Secondary | ICD-10-CM | POA: Diagnosis not present

## 2019-09-27 DIAGNOSIS — F200 Paranoid schizophrenia: Secondary | ICD-10-CM | POA: Diagnosis not present

## 2019-09-27 DIAGNOSIS — K5900 Constipation, unspecified: Secondary | ICD-10-CM | POA: Diagnosis not present

## 2019-09-27 DIAGNOSIS — R4182 Altered mental status, unspecified: Secondary | ICD-10-CM | POA: Diagnosis not present

## 2019-09-27 DIAGNOSIS — K5909 Other constipation: Secondary | ICD-10-CM | POA: Diagnosis not present

## 2019-09-27 DIAGNOSIS — N400 Enlarged prostate without lower urinary tract symptoms: Secondary | ICD-10-CM | POA: Diagnosis not present

## 2019-09-27 DIAGNOSIS — E559 Vitamin D deficiency, unspecified: Secondary | ICD-10-CM | POA: Diagnosis not present

## 2019-09-27 DIAGNOSIS — Z0000 Encounter for general adult medical examination without abnormal findings: Secondary | ICD-10-CM | POA: Diagnosis not present

## 2019-09-27 DIAGNOSIS — R9431 Abnormal electrocardiogram [ECG] [EKG]: Secondary | ICD-10-CM | POA: Diagnosis not present

## 2019-09-27 DIAGNOSIS — E782 Mixed hyperlipidemia: Secondary | ICD-10-CM | POA: Diagnosis not present

## 2019-09-27 DIAGNOSIS — D649 Anemia, unspecified: Secondary | ICD-10-CM | POA: Diagnosis not present

## 2019-09-27 DIAGNOSIS — Z79899 Other long term (current) drug therapy: Secondary | ICD-10-CM | POA: Diagnosis not present

## 2019-09-27 DIAGNOSIS — F2089 Other schizophrenia: Secondary | ICD-10-CM | POA: Diagnosis not present

## 2019-09-27 DIAGNOSIS — E538 Deficiency of other specified B group vitamins: Secondary | ICD-10-CM | POA: Diagnosis not present

## 2019-09-27 DIAGNOSIS — F209 Schizophrenia, unspecified: Secondary | ICD-10-CM | POA: Diagnosis not present

## 2019-10-16 DIAGNOSIS — F209 Schizophrenia, unspecified: Secondary | ICD-10-CM | POA: Diagnosis not present

## 2019-10-18 DIAGNOSIS — F209 Schizophrenia, unspecified: Secondary | ICD-10-CM | POA: Diagnosis not present

## 2019-11-02 DIAGNOSIS — F209 Schizophrenia, unspecified: Secondary | ICD-10-CM | POA: Diagnosis not present

## 2019-11-15 DIAGNOSIS — F209 Schizophrenia, unspecified: Secondary | ICD-10-CM | POA: Diagnosis not present

## 2019-11-30 DIAGNOSIS — F209 Schizophrenia, unspecified: Secondary | ICD-10-CM | POA: Diagnosis not present

## 2019-12-14 DIAGNOSIS — F209 Schizophrenia, unspecified: Secondary | ICD-10-CM | POA: Diagnosis not present

## 2019-12-17 IMAGING — CT CT HEAD W/O CM
1 series · 1 of 1 positions shown · non-contrast
Comparison: CT 12/31/2017 at 5556 a.m.

CLINICAL DATA: Subdural hemorrhage

EXAM:
CT HEAD WITHOUT CONTRAST
TECHNIQUE: Contiguous axial images were obtained from the base of the skull
through the vertex without intravenous contrast.

[Series 1: topogram 0.6 tr20 · sagittal · 1.00mm/px · 1 of 1 slices shown]
[im 1/1]
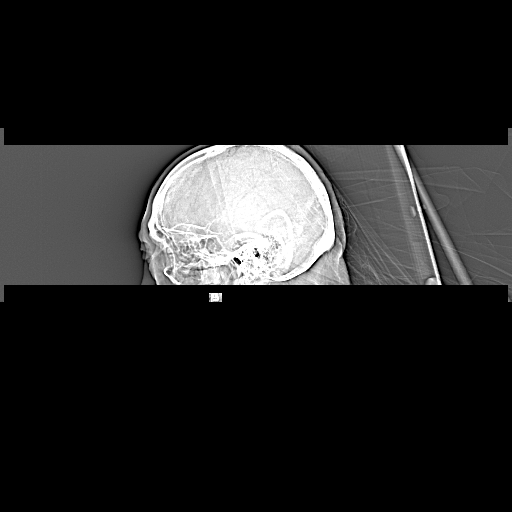

[1 of 1 positions shown; findings below may reference images not displayed]

FINDINGS: Brain: Thin subdural hematoma along the interhemispheric fissure is
not changed in volume measuring 3 mm thickness (image [DATE]). No new
extra-axial fluid collections. No intraparenchymal hemorrhage.
Intraventricular hemorrhage.

No acute intracranial hemorrhage. No focal mass lesion. No CT
evidence of acute infarction. No midline shift or mass effect. No
hydrocephalus. Basilar cisterns are patent.

Vascular: No hyperdense vessel or unexpected calcification.

Skull: No skull fracture

Sinuses/Orbits: No acute finding.

Other: Shallow scalp hematoma over the anterior LEFT frontal bone
measuring 6 mm in depth, unchanged
IMPRESSION: 1. No change in thin subdural hematoma along the superior
interhemispheric fissure.
2. Shallow LEFT frontal scalp hematoma without associated skull
fracture.
3. No interval change.
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
## 2019-12-26 DIAGNOSIS — F209 Schizophrenia, unspecified: Secondary | ICD-10-CM | POA: Diagnosis not present

## 2020-01-11 DIAGNOSIS — F209 Schizophrenia, unspecified: Secondary | ICD-10-CM | POA: Diagnosis not present

## 2020-01-15 DIAGNOSIS — E7800 Pure hypercholesterolemia, unspecified: Secondary | ICD-10-CM | POA: Diagnosis not present

## 2020-01-15 DIAGNOSIS — D519 Vitamin B12 deficiency anemia, unspecified: Secondary | ICD-10-CM | POA: Diagnosis not present

## 2020-01-15 DIAGNOSIS — N400 Enlarged prostate without lower urinary tract symptoms: Secondary | ICD-10-CM | POA: Diagnosis not present

## 2020-01-25 DIAGNOSIS — F209 Schizophrenia, unspecified: Secondary | ICD-10-CM | POA: Diagnosis not present

## 2020-02-08 DIAGNOSIS — F209 Schizophrenia, unspecified: Secondary | ICD-10-CM | POA: Diagnosis not present

## 2020-02-22 DIAGNOSIS — F209 Schizophrenia, unspecified: Secondary | ICD-10-CM | POA: Diagnosis not present

## 2020-02-26 DIAGNOSIS — F209 Schizophrenia, unspecified: Secondary | ICD-10-CM | POA: Diagnosis not present

## 2020-03-07 DIAGNOSIS — F209 Schizophrenia, unspecified: Secondary | ICD-10-CM | POA: Diagnosis not present

## 2020-03-21 DIAGNOSIS — F209 Schizophrenia, unspecified: Secondary | ICD-10-CM | POA: Diagnosis not present

## 2020-04-18 DIAGNOSIS — F209 Schizophrenia, unspecified: Secondary | ICD-10-CM | POA: Diagnosis not present

## 2020-05-01 DIAGNOSIS — F209 Schizophrenia, unspecified: Secondary | ICD-10-CM | POA: Diagnosis not present

## 2020-05-14 DIAGNOSIS — F209 Schizophrenia, unspecified: Secondary | ICD-10-CM | POA: Diagnosis not present

## 2020-05-30 DIAGNOSIS — F209 Schizophrenia, unspecified: Secondary | ICD-10-CM | POA: Diagnosis not present

## 2020-06-12 DIAGNOSIS — F209 Schizophrenia, unspecified: Secondary | ICD-10-CM | POA: Diagnosis not present

## 2020-06-26 DIAGNOSIS — F209 Schizophrenia, unspecified: Secondary | ICD-10-CM | POA: Diagnosis not present

## 2020-07-09 DIAGNOSIS — F209 Schizophrenia, unspecified: Secondary | ICD-10-CM | POA: Diagnosis not present

## 2020-07-25 DIAGNOSIS — F209 Schizophrenia, unspecified: Secondary | ICD-10-CM | POA: Diagnosis not present

## 2020-08-07 DIAGNOSIS — F209 Schizophrenia, unspecified: Secondary | ICD-10-CM | POA: Diagnosis not present

## 2020-08-15 DIAGNOSIS — Z20822 Contact with and (suspected) exposure to covid-19: Secondary | ICD-10-CM | POA: Diagnosis not present

## 2020-08-23 DIAGNOSIS — F209 Schizophrenia, unspecified: Secondary | ICD-10-CM | POA: Diagnosis not present

## 2020-08-26 DIAGNOSIS — F209 Schizophrenia, unspecified: Secondary | ICD-10-CM | POA: Diagnosis not present

## 2020-09-05 DIAGNOSIS — F209 Schizophrenia, unspecified: Secondary | ICD-10-CM | POA: Diagnosis not present

## 2020-09-18 DIAGNOSIS — F209 Schizophrenia, unspecified: Secondary | ICD-10-CM | POA: Diagnosis not present

## 2020-10-03 DIAGNOSIS — F209 Schizophrenia, unspecified: Secondary | ICD-10-CM | POA: Diagnosis not present

## 2020-10-17 DIAGNOSIS — F209 Schizophrenia, unspecified: Secondary | ICD-10-CM | POA: Diagnosis not present

## 2020-10-31 DIAGNOSIS — F209 Schizophrenia, unspecified: Secondary | ICD-10-CM | POA: Diagnosis not present

## 2020-11-14 DIAGNOSIS — F209 Schizophrenia, unspecified: Secondary | ICD-10-CM | POA: Diagnosis not present

## 2020-11-28 DIAGNOSIS — F209 Schizophrenia, unspecified: Secondary | ICD-10-CM | POA: Diagnosis not present

## 2020-12-10 DIAGNOSIS — L820 Inflamed seborrheic keratosis: Secondary | ICD-10-CM | POA: Diagnosis not present

## 2020-12-11 DIAGNOSIS — F209 Schizophrenia, unspecified: Secondary | ICD-10-CM | POA: Diagnosis not present

## 2020-12-26 DIAGNOSIS — F209 Schizophrenia, unspecified: Secondary | ICD-10-CM | POA: Diagnosis not present

## 2021-01-09 DIAGNOSIS — F209 Schizophrenia, unspecified: Secondary | ICD-10-CM | POA: Diagnosis not present

## 2021-01-23 DIAGNOSIS — F209 Schizophrenia, unspecified: Secondary | ICD-10-CM | POA: Diagnosis not present

## 2021-02-05 DIAGNOSIS — F209 Schizophrenia, unspecified: Secondary | ICD-10-CM | POA: Diagnosis not present

## 2021-02-13 DIAGNOSIS — E785 Hyperlipidemia, unspecified: Secondary | ICD-10-CM | POA: Diagnosis not present

## 2021-02-13 DIAGNOSIS — I10 Essential (primary) hypertension: Secondary | ICD-10-CM | POA: Diagnosis not present

## 2021-02-20 DIAGNOSIS — F209 Schizophrenia, unspecified: Secondary | ICD-10-CM | POA: Diagnosis not present

## 2021-03-06 DIAGNOSIS — F209 Schizophrenia, unspecified: Secondary | ICD-10-CM | POA: Diagnosis not present

## 2021-03-16 DIAGNOSIS — E785 Hyperlipidemia, unspecified: Secondary | ICD-10-CM | POA: Diagnosis not present

## 2021-03-16 DIAGNOSIS — I10 Essential (primary) hypertension: Secondary | ICD-10-CM | POA: Diagnosis not present

## 2021-03-20 DIAGNOSIS — F209 Schizophrenia, unspecified: Secondary | ICD-10-CM | POA: Diagnosis not present

## 2021-04-03 DIAGNOSIS — F209 Schizophrenia, unspecified: Secondary | ICD-10-CM | POA: Diagnosis not present

## 2021-04-16 DIAGNOSIS — E785 Hyperlipidemia, unspecified: Secondary | ICD-10-CM | POA: Diagnosis not present

## 2021-04-16 DIAGNOSIS — I10 Essential (primary) hypertension: Secondary | ICD-10-CM | POA: Diagnosis not present

## 2021-04-17 DIAGNOSIS — F209 Schizophrenia, unspecified: Secondary | ICD-10-CM | POA: Diagnosis not present

## 2021-04-30 DIAGNOSIS — F209 Schizophrenia, unspecified: Secondary | ICD-10-CM | POA: Diagnosis not present

## 2021-05-15 DIAGNOSIS — F209 Schizophrenia, unspecified: Secondary | ICD-10-CM | POA: Diagnosis not present

## 2021-05-29 DIAGNOSIS — F209 Schizophrenia, unspecified: Secondary | ICD-10-CM | POA: Diagnosis not present

## 2021-06-11 DIAGNOSIS — F209 Schizophrenia, unspecified: Secondary | ICD-10-CM | POA: Diagnosis not present

## 2021-06-26 DIAGNOSIS — F209 Schizophrenia, unspecified: Secondary | ICD-10-CM | POA: Diagnosis not present

## 2021-07-09 DIAGNOSIS — F209 Schizophrenia, unspecified: Secondary | ICD-10-CM | POA: Diagnosis not present

## 2021-07-24 DIAGNOSIS — F209 Schizophrenia, unspecified: Secondary | ICD-10-CM | POA: Diagnosis not present

## 2021-08-07 DIAGNOSIS — F209 Schizophrenia, unspecified: Secondary | ICD-10-CM | POA: Diagnosis not present

## 2021-08-22 DIAGNOSIS — F209 Schizophrenia, unspecified: Secondary | ICD-10-CM | POA: Diagnosis not present

## 2021-09-04 DIAGNOSIS — F209 Schizophrenia, unspecified: Secondary | ICD-10-CM | POA: Diagnosis not present

## 2021-09-18 DIAGNOSIS — F209 Schizophrenia, unspecified: Secondary | ICD-10-CM | POA: Diagnosis not present

## 2021-09-22 DIAGNOSIS — F209 Schizophrenia, unspecified: Secondary | ICD-10-CM | POA: Diagnosis not present

## 2021-10-02 DIAGNOSIS — F209 Schizophrenia, unspecified: Secondary | ICD-10-CM | POA: Diagnosis not present

## 2021-10-16 DIAGNOSIS — F209 Schizophrenia, unspecified: Secondary | ICD-10-CM | POA: Diagnosis not present

## 2021-10-30 DIAGNOSIS — F209 Schizophrenia, unspecified: Secondary | ICD-10-CM | POA: Diagnosis not present

## 2021-11-13 DIAGNOSIS — F209 Schizophrenia, unspecified: Secondary | ICD-10-CM | POA: Diagnosis not present

## 2021-11-27 DIAGNOSIS — F209 Schizophrenia, unspecified: Secondary | ICD-10-CM | POA: Diagnosis not present

## 2021-12-11 DIAGNOSIS — F209 Schizophrenia, unspecified: Secondary | ICD-10-CM | POA: Diagnosis not present

## 2021-12-24 DIAGNOSIS — F209 Schizophrenia, unspecified: Secondary | ICD-10-CM | POA: Diagnosis not present

## 2022-04-15 DIAGNOSIS — F209 Schizophrenia, unspecified: Secondary | ICD-10-CM | POA: Diagnosis not present

## 2022-04-17 DIAGNOSIS — N400 Enlarged prostate without lower urinary tract symptoms: Secondary | ICD-10-CM | POA: Diagnosis not present

## 2022-04-17 DIAGNOSIS — Z0000 Encounter for general adult medical examination without abnormal findings: Secondary | ICD-10-CM | POA: Diagnosis not present

## 2022-04-17 DIAGNOSIS — Z6825 Body mass index (BMI) 25.0-25.9, adult: Secondary | ICD-10-CM | POA: Diagnosis not present

## 2022-04-17 DIAGNOSIS — K5900 Constipation, unspecified: Secondary | ICD-10-CM | POA: Diagnosis not present

## 2022-04-17 DIAGNOSIS — Z79899 Other long term (current) drug therapy: Secondary | ICD-10-CM | POA: Diagnosis not present

## 2022-04-17 DIAGNOSIS — R5383 Other fatigue: Secondary | ICD-10-CM | POA: Diagnosis not present

## 2022-04-17 DIAGNOSIS — E7800 Pure hypercholesterolemia, unspecified: Secondary | ICD-10-CM | POA: Diagnosis not present

## 2022-04-17 DIAGNOSIS — Z1331 Encounter for screening for depression: Secondary | ICD-10-CM | POA: Diagnosis not present

## 2022-04-17 DIAGNOSIS — R32 Unspecified urinary incontinence: Secondary | ICD-10-CM | POA: Diagnosis not present

## 2022-04-17 DIAGNOSIS — Z6827 Body mass index (BMI) 27.0-27.9, adult: Secondary | ICD-10-CM | POA: Diagnosis not present

## 2022-06-10 DIAGNOSIS — F209 Schizophrenia, unspecified: Secondary | ICD-10-CM | POA: Diagnosis not present

## 2022-08-12 DIAGNOSIS — F209 Schizophrenia, unspecified: Secondary | ICD-10-CM | POA: Diagnosis not present
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
## 2023-03-17 ENCOUNTER — Ambulatory Visit (INDEPENDENT_AMBULATORY_CARE_PROVIDER_SITE_OTHER): Payer: PPO | Admitting: Neurology

## 2023-03-17 ENCOUNTER — Encounter: Payer: Self-pay | Admitting: Neurology

## 2023-03-17 VITALS — BP 124/82 | HR 60 | Ht 69.0 in | Wt 198.6 lb

## 2023-03-17 DIAGNOSIS — G2111 Neuroleptic induced parkinsonism: Secondary | ICD-10-CM

## 2023-03-17 DIAGNOSIS — W19XXXS Unspecified fall, sequela: Secondary | ICD-10-CM

## 2023-03-17 DIAGNOSIS — R440 Auditory hallucinations: Secondary | ICD-10-CM

## 2023-03-17 DIAGNOSIS — T43505A Adverse effect of unspecified antipsychotics and neuroleptics, initial encounter: Secondary | ICD-10-CM

## 2023-03-17 DIAGNOSIS — F209 Schizophrenia, unspecified: Secondary | ICD-10-CM

## 2023-03-17 DIAGNOSIS — G2401 Drug induced subacute dyskinesia: Secondary | ICD-10-CM | POA: Diagnosis not present

## 2023-04-14 ENCOUNTER — Telehealth: Payer: Self-pay | Admitting: Neurology

## 2023-04-14 NOTE — Telephone Encounter (Signed)
Reviewed MRI brain personally from [facility].  Old infarct, cortical, R frontal.  Some atrophy.  Otherwise unremarkable.  Chelsea, let pt/daughter know that MRI showed evidence of an old stroke but nothing new on brain.

## 2023-04-14 NOTE — Telephone Encounter (Signed)
Spoke to patients daughter and gave results.

## 2023-10-16 DEATH — deceased
